# Patient Record
Sex: Female | Born: 1965 | Race: White | Hispanic: No | Marital: Single | Smoking: Never smoker
Health system: Southern US, Community
[De-identification: ages and names within clinical notes are randomized; demographics above are authoritative.]

## PROBLEM LIST (undated history)

## (undated) DIAGNOSIS — E079 Disorder of thyroid, unspecified: Secondary | ICD-10-CM

## (undated) HISTORY — PX: LEG SURGERY: SHX1003

## (undated) HISTORY — PX: KIDNEY STONE SURGERY: SHX686

## (undated) HISTORY — PX: CHOLECYSTECTOMY: SHX55

## (undated) HISTORY — PX: BREAST BIOPSY: SHX20

## (undated) HISTORY — PX: KNEE SURGERY: SHX244

---

## 2005-03-23 ENCOUNTER — Emergency Department: Payer: Self-pay | Admitting: General Practice

## 2005-04-19 ENCOUNTER — Emergency Department: Payer: Self-pay | Admitting: Emergency Medicine

## 2005-10-09 ENCOUNTER — Emergency Department: Payer: Self-pay | Admitting: Emergency Medicine

## 2007-08-01 ENCOUNTER — Ambulatory Visit: Payer: Self-pay

## 2007-08-09 ENCOUNTER — Ambulatory Visit: Payer: Self-pay | Admitting: Surgery

## 2007-08-09 ENCOUNTER — Other Ambulatory Visit: Payer: Self-pay

## 2007-08-16 ENCOUNTER — Ambulatory Visit: Payer: Self-pay | Admitting: Surgery

## 2009-04-09 ENCOUNTER — Ambulatory Visit: Payer: Self-pay | Admitting: Family Medicine

## 2010-03-30 ENCOUNTER — Ambulatory Visit: Payer: Self-pay | Admitting: Family Medicine

## 2010-05-30 ENCOUNTER — Emergency Department: Payer: Self-pay | Admitting: Emergency Medicine

## 2011-05-25 ENCOUNTER — Ambulatory Visit: Payer: Self-pay | Admitting: Family Medicine

## 2011-06-07 ENCOUNTER — Ambulatory Visit: Payer: Self-pay | Admitting: Family Medicine

## 2012-03-12 ENCOUNTER — Ambulatory Visit: Payer: Self-pay | Admitting: Family Medicine

## 2015-03-24 ENCOUNTER — Ambulatory Visit
Admit: 2015-03-24 | Disposition: A | Discharge status: Home / Self Care | Payer: Self-pay | Attending: Family Medicine | Admitting: Family Medicine

## 2015-08-01 ENCOUNTER — Other Ambulatory Visit: Payer: Self-pay

## 2015-08-01 ENCOUNTER — Ambulatory Visit
Admission: EM | Admit: 2015-08-01 | Discharge: 2015-08-01 | Disposition: A | Discharge status: Home / Self Care | Payer: BC Managed Care – PPO | Attending: Family Medicine | Admitting: Family Medicine

## 2015-08-01 ENCOUNTER — Encounter: Payer: Self-pay | Admitting: Gynecology

## 2015-08-01 DIAGNOSIS — R0789 Other chest pain: Secondary | ICD-10-CM | POA: Insufficient documentation

## 2015-08-01 DIAGNOSIS — E079 Disorder of thyroid, unspecified: Secondary | ICD-10-CM | POA: Insufficient documentation

## 2015-08-01 DIAGNOSIS — J0100 Acute maxillary sinusitis, unspecified: Secondary | ICD-10-CM | POA: Diagnosis not present

## 2015-08-01 DIAGNOSIS — F419 Anxiety disorder, unspecified: Secondary | ICD-10-CM | POA: Diagnosis not present

## 2015-08-01 DIAGNOSIS — R51 Headache: Secondary | ICD-10-CM | POA: Diagnosis present

## 2015-08-01 HISTORY — DX: Disorder of thyroid, unspecified: E07.9

## 2015-08-01 MED ORDER — AZITHROMYCIN 250 MG PO TABS: ORAL_TABLET | ORAL | Status: DC

## 2015-08-01 NOTE — ED Notes (Signed)
Patient c/o facial pain / lower jaw pain x 2 days. Pt. C/o anxiety attack.

## 2015-08-01 NOTE — ED Provider Notes (Signed)
CSN: [id number]     Arrival date & time 08/01/15  1344 History   First MD Initiated Contact with Patient 08/01/15 1507     Chief Complaint  Patient presents with  . Facial Pain  . Anxiety   (Consider location/radiation/quality/duration/timing/severity/associated sxs/prior Treatment) Patient is a 49 y.o. female presenting with URI. The history is provided by the patient.  URI Presenting symptoms: congestion, facial pain and fatigue   Severity:  Moderate Onset quality:  Sudden Duration:  1 week Timing:  Constant Progression:  Worsening Chronicity:  New Relieved by:  Nothing Associated symptoms: neck pain and sinus pain   Associated symptoms comment:  Anxiety; states felt slight chest discomfort since last night "probably due to anxiety or worrying".   Past Medical History  Diagnosis Date  . Thyroid disease    Past Surgical History  Procedure Laterality Date  . Leg surgery      left  . Knee surgery      left  . Cholecystectomy    . Kidney stone surgery     No family history on file. Social History  Substance Use Topics  . Smoking status: Never Smoker   . Smokeless tobacco: None  . Alcohol Use: No   OB History    No data available     Review of Systems  Constitutional: Positive for fatigue.  HENT: Positive for congestion.   Musculoskeletal: Positive for neck pain.    Allergies  Iodine; Latex; Penicillins; Sulfa antibiotics; and Ciprofloxacin  Home Medications   Prior to Admission medications   Medication Sig Start Date End Date Taking? Authorizing Provider  levothyroxine (SYNTHROID, LEVOTHROID) 125 MCG tablet Take 125 mcg by mouth daily before breakfast.   Yes Historical Provider, MD  azithromycin (ZITHROMAX Z-PAK) 250 MG tablet 2 tabs po once day 1, then 1 tab po qd for next 4 days 08/01/15   Stephanie Mccallum, MD   BP 129/69 mmHg  Pulse 60  Temp(Src) 97.7 F (36.5 C) (Oral)  Resp 16  Ht  (1.778 m)  Wt 225 lb (102.059 kg)  BMI 32.28 kg/m2  SpO2  100% Physical Exam  Constitutional: She appears well-developed and well-nourished. No distress.  HENT:  Head: Normocephalic and atraumatic.  Right Ear: Tympanic membrane, external ear and ear canal normal.  Left Ear: Tympanic membrane, external ear and ear canal normal.  Nose: Mucosal edema and rhinorrhea present. No nose lacerations, sinus tenderness, nasal deformity, septal deviation or nasal septal hematoma. No epistaxis.  No foreign bodies. Right sinus exhibits maxillary sinus tenderness and frontal sinus tenderness. Left sinus exhibits maxillary sinus tenderness and frontal sinus tenderness.  Mouth/Throat: Uvula is midline, oropharynx is clear and moist and mucous membranes are normal. No oropharyngeal exudate.  Eyes: Conjunctivae and EOM are normal. Pupils are equal, round, and reactive to light. Right eye exhibits no discharge. Left eye exhibits no discharge. No scleral icterus.  Neck: Normal range of motion. Neck supple. No thyromegaly present.  Cardiovascular: Normal rate, regular rhythm and normal heart sounds.   Pulmonary/Chest: Effort normal and breath sounds normal. No respiratory distress. She has no wheezes. She has no rales.  Lymphadenopathy:    She has no cervical adenopathy.  Skin: She is not diaphoretic.  Nursing note and vitals reviewed.   ED Course  Procedures (including critical care time) Labs Review Labs Reviewed - No data to display  Imaging Review No results found.  EKG: normal EKG, normal sinus rhythm, there are no previous tracings available for comparison; reviewed by  me and agree with readout. MDM   1. Acute maxillary sinusitis, recurrence not specified   2. Anxiety   3. Chest discomfort    Discharge Medication List as of 08/01/2015  3:41 PM    START taking these medications   Details  azithromycin (ZITHROMAX Z-PAK) 250 MG tablet 2 tabs po once day 1, then 1 tab po qd for next 4 days, Normal      Plan: 1. diagnosis reviewed with patient 2. rx as  per orders; risks, benefits, potential side effects reviewed with patient 3. Recommend supportive treatment with otc flonase 4. F/u prn if symptoms worsen or don't improve    Stephanie Mccallum, MD 08/02/15 1455

## 2017-04-22 ENCOUNTER — Ambulatory Visit
Admission: EM | Admit: 2017-04-22 | Discharge: 2017-04-22 | Disposition: A | Discharge status: Home / Self Care | Payer: BC Managed Care – PPO | Attending: Family Medicine | Admitting: Family Medicine

## 2017-04-22 DIAGNOSIS — J069 Acute upper respiratory infection, unspecified: Secondary | ICD-10-CM | POA: Diagnosis not present

## 2017-04-22 DIAGNOSIS — R05 Cough: Secondary | ICD-10-CM

## 2017-04-22 DIAGNOSIS — B9789 Other viral agents as the cause of diseases classified elsewhere: Secondary | ICD-10-CM

## 2017-04-22 LAB — RAPID INFLUENZA A&B ANTIGENS (ARMC ONLY)
INFLUENZA A (ARMC): NEGATIVE
INFLUENZA B (ARMC): NEGATIVE

## 2017-04-22 NOTE — ED Triage Notes (Signed)
Patient complains of cough, congestion, fever, severe body aches. Patient states that symptoms started suddently on Friday PM. Patient states that she feels like she has the flu.

## 2017-04-22 NOTE — ED Provider Notes (Signed)
[facility]    CSN: [id number] Arrival date & time: 04/22/17  1304     History   Chief Complaint Chief Complaint  Patient presents with  . Cough    HPI Stephanie Bright is a 51 y.o. female.    Cough  Associated symptoms: fever and myalgias   Associated symptoms: no headaches and no wheezing   URI  Presenting symptoms: congestion, cough and fever   Severity:  Moderate Onset quality:  Sudden Timing:  Constant Progression:  Worsening Chronicity:  New Relieved by:  OTC medications Associated symptoms: myalgias   Associated symptoms: no headaches and no wheezing   Risk factors: not elderly, no chronic cardiac disease, no chronic kidney disease, no chronic respiratory disease, no diabetes mellitus, no immunosuppression, no recent illness and no recent travel     Past Medical History:  Diagnosis Date  . Thyroid disease     There are no active problems to display for this patient.   Past Surgical History:  Procedure Laterality Date  . CHOLECYSTECTOMY    . KIDNEY STONE SURGERY    . KNEE SURGERY     left  . LEG SURGERY     left    OB History    No data available       Home Medications    Prior to Admission medications   Medication Sig Start Date End Date Taking? Authorizing Provider  levothyroxine (SYNTHROID, LEVOTHROID) 125 MCG tablet Take 125 mcg by mouth daily before breakfast.   Yes [provider]  azithromycin (ZITHROMAX Z-PAK) 250 MG tablet 2 tabs po once day 1, then 1 tab po qd for next 4 days 08/01/15   Payton Mccallum, MD    Family History History reviewed. No pertinent family history.  Social History Social History  Substance Use Topics  . Smoking status: Never Smoker  . Smokeless tobacco: Never Used  . Alcohol use No     Allergies   Iodine; Latex; Penicillins; Sulfa antibiotics; and Ciprofloxacin   Review of Systems Review of Systems  Constitutional: Positive for fever.  HENT: Positive for congestion.    Respiratory: Positive for cough. Negative for wheezing.   Musculoskeletal: Positive for myalgias.  Neurological: Negative for headaches.     Physical Exam Triage Vital Signs ED Triage Vitals  Enc Vitals Group     BP 04/22/17 1346 134/76     Pulse Rate 04/22/17 1346 68     Resp 04/22/17 1346 18     Temp 04/22/17 1346 98 F (36.7 C)     Temp Source 04/22/17 1346 Oral     SpO2 04/22/17 1346 100 %     Weight 04/22/17 1345 225 lb (102.1 kg)     Height 04/22/17 1345 5\' 10"  (1.778 m)     Head Circumference --      Peak Flow --      Pain Score 04/22/17 1345 5     Pain Loc --      Pain Edu? --      Excl. in [place]? --    No data found.   Updated Vital Signs BP 134/76 (BP Location: Left Arm)   Pulse 68   Temp 98 F (36.7 C) (Oral)   Resp 18   Ht 5\' 10"  (1.778 m)   Wt 225 lb (102.1 kg)   SpO2 100%   BMI 32.28 kg/m   Visual Acuity Right Eye Distance:   Left Eye Distance:   Bilateral Distance:    Right Eye  Near:   Left Eye Near:    Bilateral Near:     Physical Exam  Constitutional: She appears well-developed and well-nourished. No distress.  HENT:  Head: Normocephalic and atraumatic.  Right Ear: Tympanic membrane, external ear and ear canal normal.  Left Ear: Tympanic membrane, external ear and ear canal normal.  Nose: No mucosal edema, rhinorrhea, nose lacerations, sinus tenderness, nasal deformity, septal deviation or nasal septal hematoma. No epistaxis.  No foreign bodies. Right sinus exhibits no maxillary sinus tenderness and no frontal sinus tenderness. Left sinus exhibits no maxillary sinus tenderness and no frontal sinus tenderness.  Mouth/Throat: Uvula is midline, oropharynx is clear and moist and mucous membranes are normal. No oropharyngeal exudate.  Eyes: Conjunctivae and EOM are normal. Pupils are equal, round, and reactive to light. Right eye exhibits no discharge. Left eye exhibits no discharge. No scleral icterus.  Neck: Normal range of motion. Neck supple.  No thyromegaly present.  Cardiovascular: Normal rate, regular rhythm and normal heart sounds.   Pulmonary/Chest: Effort normal and breath sounds normal. No respiratory distress. She has no wheezes. She has no rales.  Lymphadenopathy:    She has no cervical adenopathy.  Skin: She is not diaphoretic.  Nursing note and vitals reviewed.    UC Treatments / Results  Labs (all labs ordered are listed, but only abnormal results are displayed) Labs Reviewed  RAPID INFLUENZA A&B ANTIGENS ([facility] ONLY)    EKG  EKG Interpretation None       Radiology No results found.  Procedures Procedures (including critical care time)  Medications Ordered in UC Medications - No data to display   Initial Impression / Assessment and Plan / UC Course  I have reviewed the triage vital signs and the nursing notes.  Pertinent labs & imaging results that were available during my care of the patient were reviewed by me and considered in my medical decision making (see chart for details).       Final Clinical Impressions(s) / UC Diagnoses   Final diagnoses:  Viral URI with cough    New Prescriptions Discharge Medication List as of 04/22/2017  2:26 PM     1. diagnosis reviewed with patient 2. Recommend continue supportive treatment with otc meds, rest, fluids 3. Follow-up prn if symptoms worsen or don't improve   Payton Mccallumonty, Ondre Salvetti, MD 04/22/17 1440

## 2018-06-13 ENCOUNTER — Other Ambulatory Visit: Payer: Self-pay

## 2018-06-13 ENCOUNTER — Encounter: Payer: Self-pay | Admitting: Emergency Medicine

## 2018-06-13 ENCOUNTER — Ambulatory Visit
Admission: EM | Admit: 2018-06-13 | Discharge: 2018-06-13 | Disposition: A | Discharge status: Home / Self Care | Payer: BC Managed Care – PPO | Attending: Family Medicine | Admitting: Family Medicine

## 2018-06-13 DIAGNOSIS — M5489 Other dorsalgia: Secondary | ICD-10-CM

## 2018-06-13 DIAGNOSIS — Z79890 Hormone replacement therapy (postmenopausal): Secondary | ICD-10-CM | POA: Diagnosis not present

## 2018-06-13 DIAGNOSIS — Z880 Allergy status to penicillin: Secondary | ICD-10-CM | POA: Insufficient documentation

## 2018-06-13 DIAGNOSIS — Z841 Family history of disorders of kidney and ureter: Secondary | ICD-10-CM | POA: Insufficient documentation

## 2018-06-13 DIAGNOSIS — Z881 Allergy status to other antibiotic agents status: Secondary | ICD-10-CM | POA: Diagnosis not present

## 2018-06-13 DIAGNOSIS — Z833 Family history of diabetes mellitus: Secondary | ICD-10-CM | POA: Diagnosis not present

## 2018-06-13 DIAGNOSIS — Z9049 Acquired absence of other specified parts of digestive tract: Secondary | ICD-10-CM | POA: Diagnosis not present

## 2018-06-13 DIAGNOSIS — Z801 Family history of malignant neoplasm of trachea, bronchus and lung: Secondary | ICD-10-CM | POA: Diagnosis not present

## 2018-06-13 DIAGNOSIS — Z888 Allergy status to other drugs, medicaments and biological substances status: Secondary | ICD-10-CM | POA: Diagnosis not present

## 2018-06-13 DIAGNOSIS — R0789 Other chest pain: Secondary | ICD-10-CM | POA: Diagnosis not present

## 2018-06-13 DIAGNOSIS — E669 Obesity, unspecified: Secondary | ICD-10-CM | POA: Diagnosis not present

## 2018-06-13 DIAGNOSIS — E039 Hypothyroidism, unspecified: Secondary | ICD-10-CM | POA: Diagnosis not present

## 2018-06-13 DIAGNOSIS — Z6833 Body mass index (BMI) 33.0-33.9, adult: Secondary | ICD-10-CM | POA: Diagnosis not present

## 2018-06-13 DIAGNOSIS — Z882 Allergy status to sulfonamides status: Secondary | ICD-10-CM | POA: Insufficient documentation

## 2018-06-13 DIAGNOSIS — Z8249 Family history of ischemic heart disease and other diseases of the circulatory system: Secondary | ICD-10-CM | POA: Insufficient documentation

## 2018-06-13 DIAGNOSIS — Z823 Family history of stroke: Secondary | ICD-10-CM | POA: Insufficient documentation

## 2018-06-13 DIAGNOSIS — R079 Chest pain, unspecified: Secondary | ICD-10-CM | POA: Diagnosis present

## 2018-06-13 NOTE — ED Triage Notes (Signed)
Patient c/o chest pain that around 2 pm.  Patient reports pain that gets worse when she takes a deep breath.

## 2018-06-13 NOTE — ED Provider Notes (Signed)
[facility]    CSN: [id number] Arrival date & time: 06/13/18  1737  History   Chief Complaint Chief Complaint  Patient presents with  . Chest Pain   HPI  52 year old female presents with chest pain.  Patient states that she was pulling weeds earlier today.  She states that she was at home lying in the bed and developed chest pain.  Location: Sternal.  Describes the pain as sharp.  Worse with inspiration and certain movements.  Relieved with rest.  No nausea or vomiting.  No diaphoresis.  No association with exertion.  No shortness of breath.  Patient also reports associated back pain.  No other symptoms.  No other complaints at this time.  PMH: Hypothyroid, unspecified 03/05/2012   Interstitial cystitis  with hematuria  Obesity  Surgical Hx: CHOLECYSTECTOMY      steel rod in left femur 1993 Left    BREAST LUMPECTOMY 2011, 2009 Right    ROUX-EN-Y PROCEDURE 2009     PHOTOREFRACTIVE KERATOTOMY/LASIK      knee surgery for necrotic tissue removal      Home Medications    Prior to Admission medications   Medication Sig Start Date End Date Taking? Authorizing Provider  levothyroxine (SYNTHROID, LEVOTHROID) 125 MCG tablet Take 125 mcg by mouth daily before breakfast.   Yes [provider]  azithromycin (ZITHROMAX Z-PAK) 250 MG tablet 2 tabs po once day 1, then 1 tab po qd for next 4 days 08/01/15   Payton Mccallum, MD   Family History Prostate cancer Father    Stroke Father    Alzheimer's disease Maternal Grandmother    High blood pressure (Hypertension) Maternal Grandmother    Breast cancer Mother    Cataracts Mother    Chronic kidney disease Mother    Diabetes type II Mother    High blood pressure (Hypertension) Mother    Lung cancer Mother    Thyroid disease Mother    Coronary Artery Disease (Blocked arteries around heart) Paternal Grandmother    Coronary Artery Disease (Blocked arteries around heart) Paternal Uncle     Cancer Paternal Uncle half   Coronary Artery Disease (Blocked arteries around heart) Paternal Uncle half   No Known Problems Sister     Social History Social History   Tobacco Use  . Smoking status: Never Smoker  . Smokeless tobacco: Never Used  Substance Use Topics  . Alcohol use: No  . Drug use: No   Allergies   Iodine; Latex; Penicillins; Sulfa antibiotics; and Ciprofloxacin  Review of Systems Review of Systems  Respiratory: Negative.   Cardiovascular: Positive for chest pain.  Musculoskeletal: Positive for back pain.   Physical Exam Triage Vital Signs ED Triage Vitals  Enc Vitals Group     BP 06/13/18 1746 129/81     Pulse Rate 06/13/18 1746 68     Resp 06/13/18 1746 16     Temp 06/13/18 1746 98 F (36.7 C)     Temp Source 06/13/18 1746 Oral     SpO2 06/13/18 1746 100 %     Weight 06/13/18 1744 235 lb (106.6 kg)     Height 06/13/18 1744 5\' 10"  (1.778 m)     Head Circumference --      Peak Flow --      Pain Score 06/13/18 1744 5     Pain Loc --      Pain Edu? --      Excl. in GC? --    Updated Vital Signs BP  129/81 (BP Location: Left Arm)   Pulse 68   Temp 98 F (36.7 C) (Oral)   Resp 16   Ht 5\' 10"  (1.778 m)   Wt 235 lb (106.6 kg)   SpO2 100%   BMI 33.72 kg/m   Physical Exam  Constitutional: She is oriented to person, place, and time. She appears well-developed. No distress.  HENT:  Head: Normocephalic and atraumatic.  Mouth/Throat: Oropharynx is clear and moist.  Cardiovascular: Normal rate and regular rhythm.  Pulmonary/Chest: Effort normal and breath sounds normal. She has no wheezes. She has no rales. She exhibits no tenderness.  Abdominal: Soft. She exhibits no distension. There is no tenderness.  Neurological: She is alert and oriented to person, place, and time.  Psychiatric: She has a normal mood and affect. Her behavior is normal.  Nursing note and vitals reviewed.  UC Treatments / Results  Labs (all labs ordered are listed, but  only abnormal results are displayed) Labs Reviewed - No data to display  EKG   Date: 06/13/2018  EKG Time: 6:15 PM  Rate: 69  Rhythm: Normal Sinus rhythm.  Axis: Normal.   Intervals:Normal.  ST&T Change: None.  Narrative Interpretation:  Normal sinus rhythm with a rate of 69.  Normal intervals.  No ST or T wave changes.  Normal EKG.  Radiology No results found.  Procedures Procedures (including critical care time)  Medications Ordered in UC Medications - No data to display  Initial Impression / Assessment and Plan / UC Course  I have reviewed the triage vital signs and the nursing notes.  Pertinent labs & imaging results that were available during my care of the patient were reviewed by me and considered in my medical decision making (see chart for details).    52 year old female presents with chest pain.  Likely musculoskeletal/chest wall pain.  EKG negative.  History not consistent with cardiac chest pain.  Advised Tylenol and supportive care.  Final Clinical Impressions(s) / UC Diagnoses   Final diagnoses:  Chest wall pain     Discharge Instructions     Rest.  Tylenol.  If persists or worsens, go to the hospital.  Take care  Dr. Adriana Simasook     ED Prescriptions    None     Controlled Substance Prescriptions [place] Controlled Substance Registry consulted? Not Applicable   Tommie SamsCook, Deanna Wiater G, DO 06/13/18 1815

## 2018-06-13 NOTE — Discharge Instructions (Signed)
Rest.  Tylenol.  If persists or worsens, go to the hospital.  Take care  Dr. Adriana Simasook

## 2019-05-26 DIAGNOSIS — R197 Diarrhea, unspecified: Secondary | ICD-10-CM | POA: Diagnosis not present

## 2019-05-30 DIAGNOSIS — B001 Herpesviral vesicular dermatitis: Secondary | ICD-10-CM | POA: Diagnosis not present

## 2019-05-30 DIAGNOSIS — Z1231 Encounter for screening mammogram for malignant neoplasm of breast: Secondary | ICD-10-CM | POA: Diagnosis not present

## 2019-05-30 DIAGNOSIS — R197 Diarrhea, unspecified: Secondary | ICD-10-CM | POA: Diagnosis not present

## 2019-10-09 ENCOUNTER — Other Ambulatory Visit: Payer: Self-pay | Admitting: Family Medicine

## 2019-10-09 DIAGNOSIS — Z1231 Encounter for screening mammogram for malignant neoplasm of breast: Secondary | ICD-10-CM

## 2019-10-17 ENCOUNTER — Other Ambulatory Visit: Payer: Self-pay

## 2019-10-17 ENCOUNTER — Ambulatory Visit
Admission: EM | Admit: 2019-10-17 | Discharge: 2019-10-17 | Disposition: A | Discharge status: Home / Self Care | Payer: BC Managed Care – PPO | Attending: Family Medicine | Admitting: Family Medicine

## 2019-10-17 DIAGNOSIS — G44209 Tension-type headache, unspecified, not intractable: Secondary | ICD-10-CM | POA: Diagnosis present

## 2019-10-17 DIAGNOSIS — M940 Chondrocostal junction syndrome [Tietze]: Secondary | ICD-10-CM | POA: Diagnosis present

## 2019-10-17 DIAGNOSIS — F419 Anxiety disorder, unspecified: Secondary | ICD-10-CM | POA: Diagnosis present

## 2019-10-17 NOTE — ED Triage Notes (Signed)
Patient states that she has been having anxiety that has started over the past month or so. Patient states that last night she was having some anxiety last night  Patient states that she lifted something heavy and has had pain in between her shoulders and chest pain that started last night, headache started last night as well. Patient states that she feels like her breasts are swollen like when she has menstrual cycle. Patient states that she just felt like she needed to come and get checked out.

## 2019-10-17 NOTE — ED Provider Notes (Signed)
[facility]    CSN: [id number] Arrival date & time: 10/17/19  0831      History   Chief Complaint Chief Complaint  Patient presents with  . Headache  . Anxiety    HPI Stephanie Bright is a 53 y.o. female.   53 yo female with a c/o anxiety over the past month or two. Also c/o intermittent chest heaviness and headaches. Denies any fevers, chills, cough, sick contacts.      Past Medical History:  Diagnosis Date  . Thyroid disease     There are no active problems to display for this patient.   Past Surgical History:  Procedure Laterality Date  . CHOLECYSTECTOMY    . KIDNEY STONE SURGERY    . KNEE SURGERY     left  . LEG SURGERY     left    OB History   No obstetric history on file.      Home Medications    Prior to Admission medications   Medication Sig Start Date End Date Taking? Authorizing Provider  levothyroxine (SYNTHROID, LEVOTHROID) 125 MCG tablet Take 125 mcg by mouth daily before breakfast.   Yes [provider]  azithromycin (ZITHROMAX Z-PAK) 250 MG tablet 2 tabs po once day 1, then 1 tab po qd for next 4 days 08/01/15   Norval Gable, MD    Family History Family History  Problem Relation Age of Onset  . Diabetes Mother   . Kidney disease Mother   . Lung cancer Mother   . Breast cancer Mother   . CVA Father   . Heart attack Father     Social History Social History   Tobacco Use  . Smoking status: Never Smoker  . Smokeless tobacco: Never Used  Substance Use Topics  . Alcohol use: No  . Drug use: No     Allergies   Iodine, Latex, Penicillins, Sulfa antibiotics, and Ciprofloxacin   Review of Systems Review of Systems   Physical Exam Triage Vital Signs ED Triage Vitals  Enc Vitals Group     BP 10/17/19 0849 128/68     Pulse Rate 10/17/19 0849 61     Resp 10/17/19 0849 18     Temp 10/17/19 0849 97.7 F (36.5 C)     Temp Source 10/17/19 0849 Oral     SpO2 10/17/19 0849 100 %     Weight  10/17/19 0847 (!) 336 lb (152.4 kg)     Height 10/17/19 0847 5\' 10"  (1.778 m)     Head Circumference --      Peak Flow --      Pain Score 10/17/19 0847 7     Pain Loc --      Pain Edu? --      Excl. in [place]? --    No data found.  Updated Vital Signs BP 128/68 (BP Location: Left Arm)   Pulse 61   Temp 97.7 F (36.5 C) (Oral)   Resp 18   Ht 5\' 10"  (1.778 m)   Wt (!) 152.4 kg   SpO2 100%   BMI 48.21 kg/m   Visual Acuity Right Eye Distance:   Left Eye Distance:   Bilateral Distance:    Right Eye Near:   Left Eye Near:    Bilateral Near:     Physical Exam Vitals signs and nursing note reviewed.  Constitutional:      General: She is not in acute distress.    Appearance: She is not toxic-appearing or  diaphoretic.  Eyes:     Extraocular Movements: Extraocular movements intact.     Pupils: Pupils are equal, round, and reactive to light.  Cardiovascular:     Rate and Rhythm: Normal rate and regular rhythm.     Heart sounds: Normal heart sounds.  Pulmonary:     Effort: Pulmonary effort is normal. No respiratory distress.     Breath sounds: Normal breath sounds.  Chest:     Chest wall: Tenderness present.  Musculoskeletal:     Right lower leg: No edema.     Left lower leg: No edema.  Neurological:     General: No focal deficit present.     Mental Status: She is alert and oriented to person, place, and time.      UC Treatments / Results  Labs (all labs ordered are listed, but only abnormal results are displayed) Labs Reviewed - No data to display  EKG   Radiology No results found.  Procedures ED EKG  Date/Time: 10/31/2019 11:25 AM Performed by: Payton Mccallum, MD Authorized by: Payton Mccallum, MD   ECG reviewed by ED Physician in the absence of a cardiologist: yes   Previous ECG:    Previous ECG:  Compared to current   Similarity:  No change Interpretation:    Interpretation: normal   Rate:    ECG rate assessment: bradycardic   Rhythm:    Rhythm:  sinus bradycardia   Ectopy:    Ectopy: none   QRS:    QRS axis:  Normal   QRS intervals:  Normal Conduction:    Conduction: normal   ST segments:    ST segments:  Normal T waves:    T waves: normal     (including critical care time)  Medications Ordered in UC Medications - No data to display  Initial Impression / Assessment and Plan / UC Course  I have reviewed the triage vital signs and the nursing notes.  Pertinent labs & imaging results that were available during my care of the patient were reviewed by me and considered in my medical decision making (see chart for details).     Final Clinical Impressions(s) / UC Diagnoses   Final diagnoses:  Anxiety  Acute non intractable tension-type headache  Costochondritis     Discharge Instructions     Rest, over the counter tylenol/motrin as needed Follow up with primary care provider    ED Prescriptions    None      1. ekg results and diagnosis reviewed with patient 2. Recommend supportive treatment as above 3. F/u with pcp  4. Follow-up prn if symptoms worsen or don't improve   PDMP not reviewed this encounter.   Payton Mccallum, MD 10/31/19 [phone]

## 2019-10-17 NOTE — Discharge Instructions (Signed)
Rest, over the counter tylenol/motrin as needed Follow up with primary care provider

## 2020-01-21 ENCOUNTER — Other Ambulatory Visit: Payer: Self-pay

## 2020-01-21 ENCOUNTER — Ambulatory Visit
Admission: RE | Admit: 2020-01-21 | Discharge: 2020-01-21 | Disposition: A | Discharge status: Home / Self Care | Payer: BC Managed Care – PPO | Source: Ambulatory Visit | Attending: Family Medicine | Admitting: Family Medicine

## 2020-01-21 DIAGNOSIS — Z1231 Encounter for screening mammogram for malignant neoplasm of breast: Secondary | ICD-10-CM | POA: Diagnosis present

## 2020-01-26 ENCOUNTER — Other Ambulatory Visit: Payer: Self-pay | Admitting: *Deleted

## 2020-01-26 ENCOUNTER — Inpatient Hospital Stay
Admission: RE | Admit: 2020-01-26 | Discharge: 2020-01-26 | Disposition: A | Discharge status: Home / Self Care | Payer: Self-pay | Source: Ambulatory Visit | Attending: *Deleted | Admitting: *Deleted

## 2020-01-26 DIAGNOSIS — Z1231 Encounter for screening mammogram for malignant neoplasm of breast: Secondary | ICD-10-CM

## 2021-01-02 IMAGING — MG DIGITAL SCREENING BILAT W/ TOMO W/ CAD
6 of 12 series · 6 of 36 positions shown · non-contrast
Comparison: Previous exam(s).

ACR Breast Density Category a: The breast tissue is almost entirely
fatty.

CLINICAL DATA: Screening.

EXAM:
DIGITAL SCREENING BILATERAL MAMMOGRAM WITH TOMO AND CAD

[R CC synth-2D]
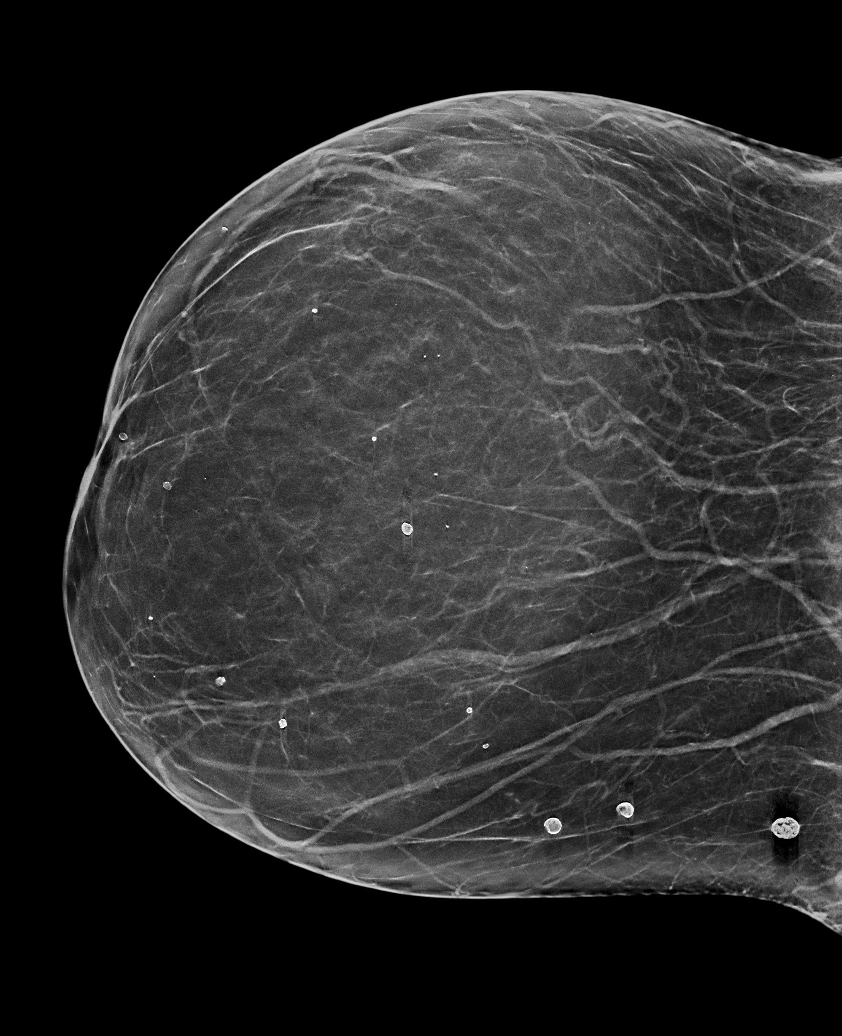

[L CC synth-2D]
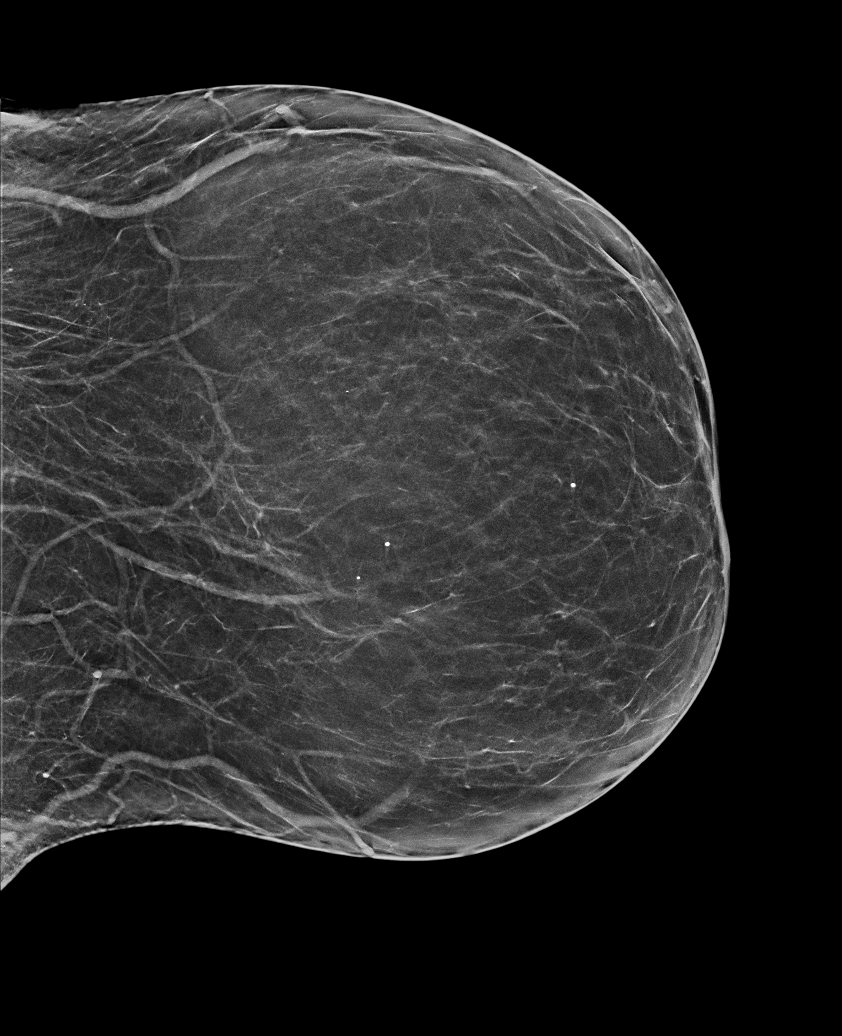

[R MLO synth-2D (1 of 2)]
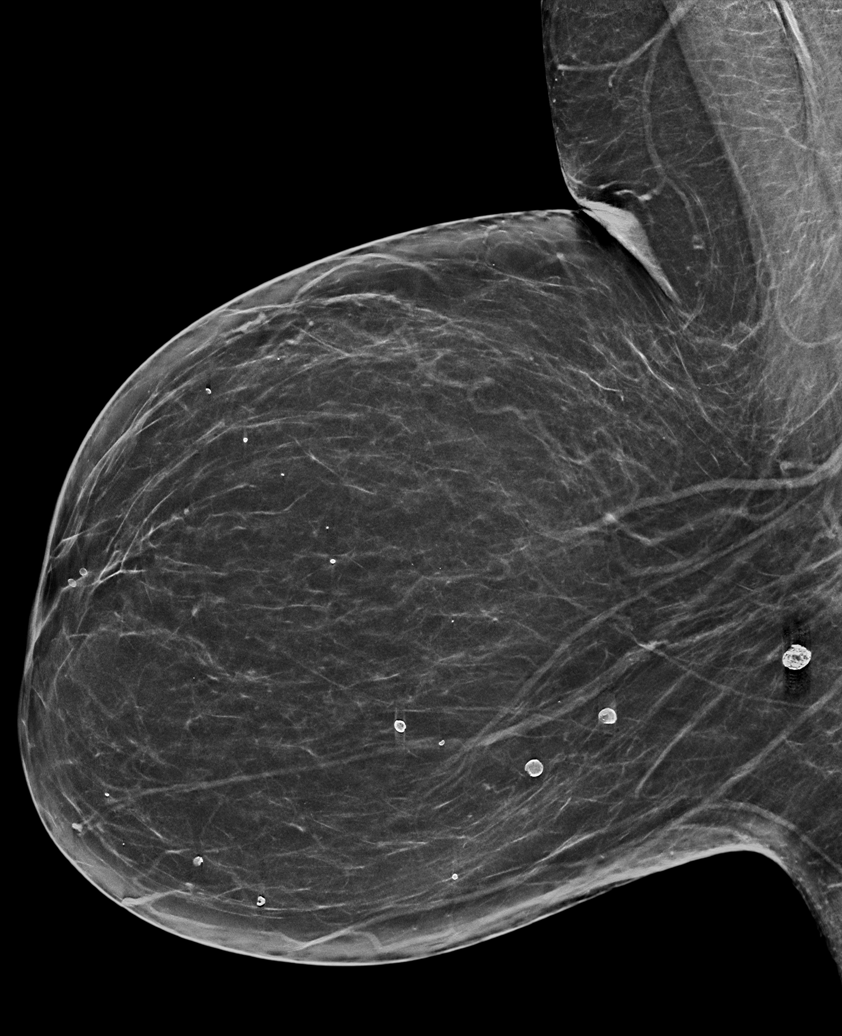

[R MLO synth-2D (2 of 2)]
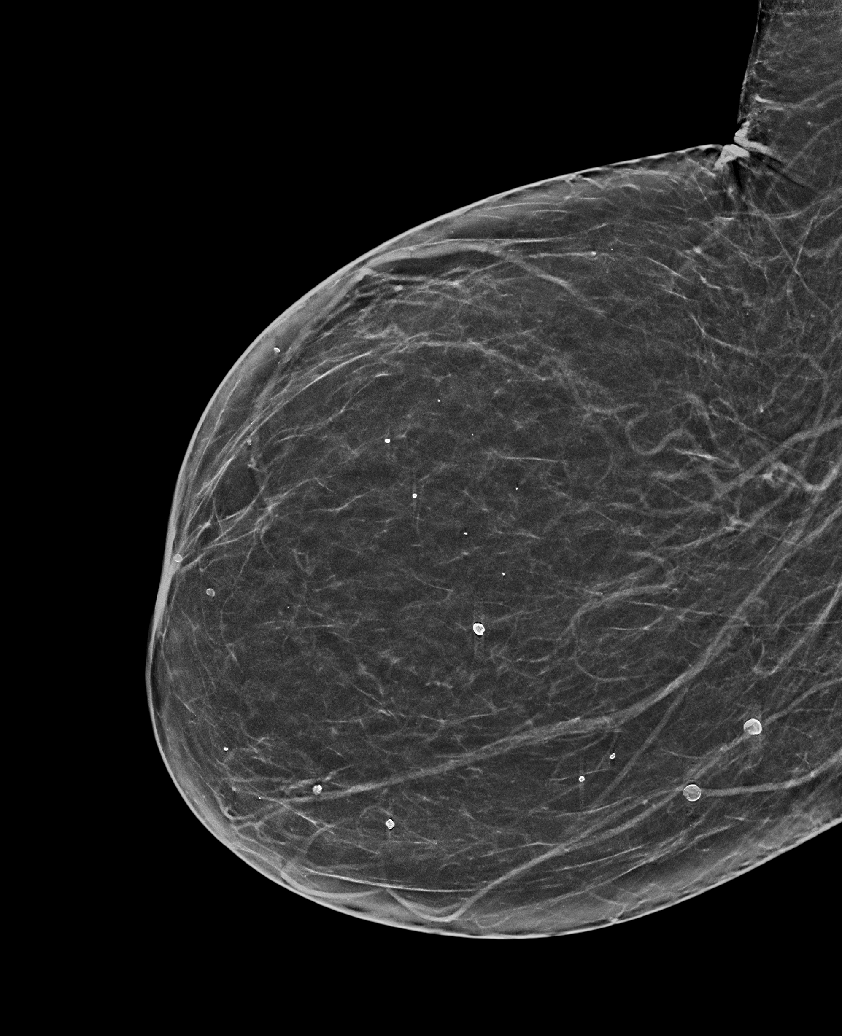

[L MLO synth-2D (1 of 2)]
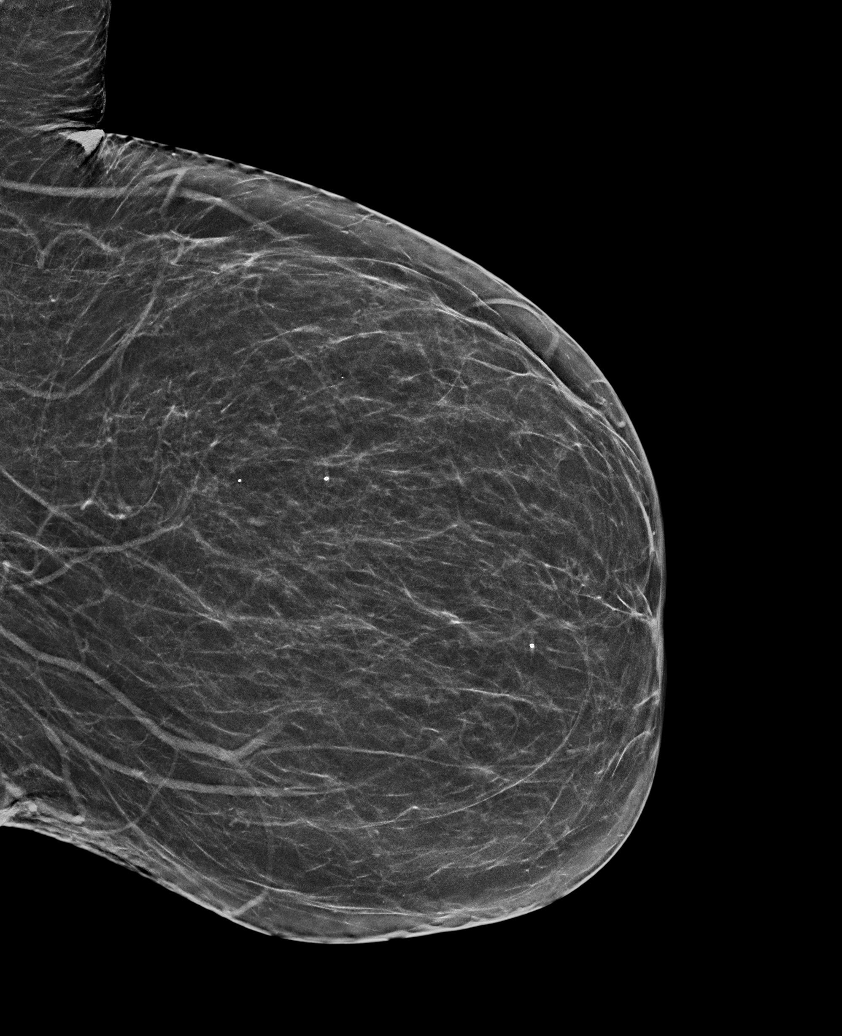

[L MLO synth-2D (2 of 2)]
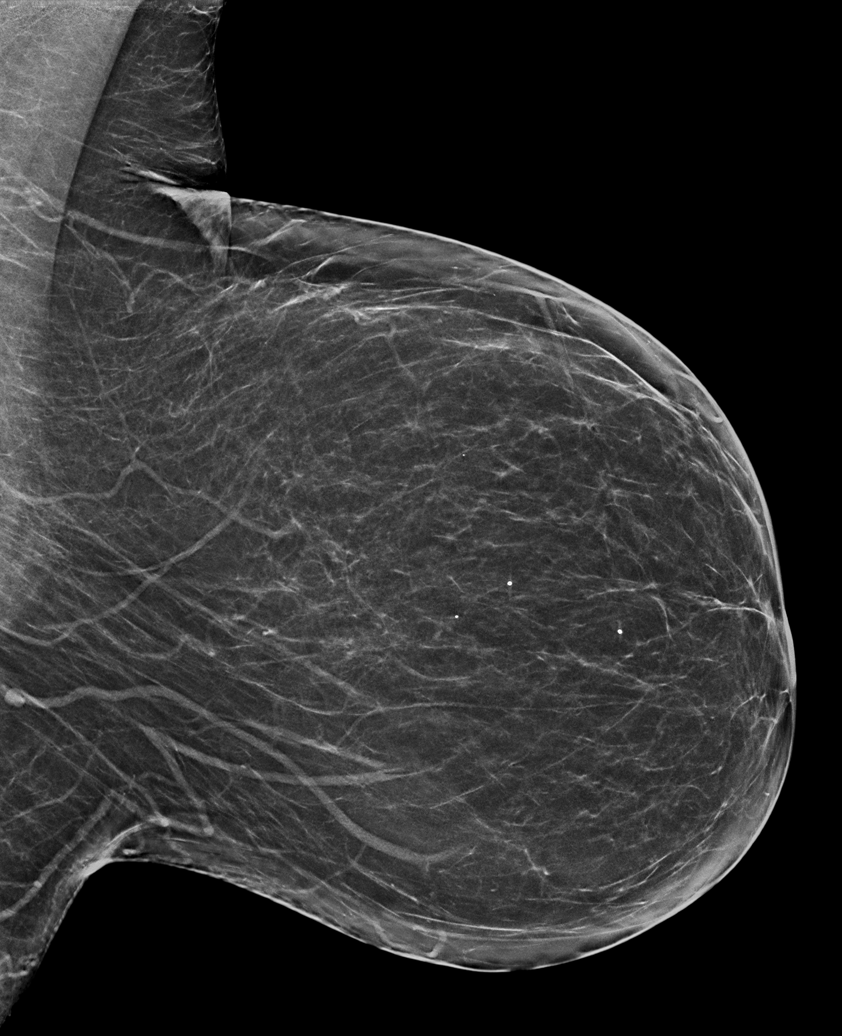

[6 of 36 positions shown; findings below may reference images not displayed]

FINDINGS: There are no findings suspicious for malignancy. Images were
processed with CAD.
IMPRESSION: No mammographic evidence of malignancy. A result letter of this
screening mammogram will be mailed directly to the patient.

RECOMMENDATION:
Screening mammogram in one year. (Code:8Y-[id number])

BI-RADS CATEGORY  1: Negative.

## 2023-04-10 ENCOUNTER — Other Ambulatory Visit: Payer: Self-pay | Admitting: Internal Medicine

## 2023-04-10 DIAGNOSIS — Z1231 Encounter for screening mammogram for malignant neoplasm of breast: Secondary | ICD-10-CM

## 2023-06-04 ENCOUNTER — Ambulatory Visit
Admission: RE | Admit: 2023-06-04 | Discharge: 2023-06-04 | Disposition: A | Discharge status: Home / Self Care | Payer: BC Managed Care – PPO | Source: Ambulatory Visit | Attending: Internal Medicine | Admitting: Internal Medicine

## 2023-06-04 DIAGNOSIS — Z1231 Encounter for screening mammogram for malignant neoplasm of breast: Secondary | ICD-10-CM | POA: Diagnosis present

## 2024-08-17 ENCOUNTER — Ambulatory Visit
Admission: EM | Admit: 2024-08-17 | Discharge: 2024-08-17 | Disposition: A | Discharge status: Home / Self Care | Attending: Family Medicine | Admitting: Family Medicine

## 2024-08-17 ENCOUNTER — Ambulatory Visit: Payer: Self-pay | Admitting: Family Medicine

## 2024-08-17 ENCOUNTER — Ambulatory Visit (INDEPENDENT_AMBULATORY_CARE_PROVIDER_SITE_OTHER)

## 2024-08-17 DIAGNOSIS — J181 Lobar pneumonia, unspecified organism: Secondary | ICD-10-CM | POA: Diagnosis not present

## 2024-08-17 DIAGNOSIS — J069 Acute upper respiratory infection, unspecified: Secondary | ICD-10-CM | POA: Diagnosis present

## 2024-08-17 DIAGNOSIS — J189 Pneumonia, unspecified organism: Secondary | ICD-10-CM | POA: Insufficient documentation

## 2024-08-17 LAB — SARS CORONAVIRUS 2 BY RT PCR: SARS Coronavirus 2 by RT PCR: NEGATIVE

## 2024-08-17 MED ORDER — PREDNISONE 10 MG (21) PO TBPK: ORAL_TABLET | Freq: Every day | ORAL | 0 refills | Status: AC

## 2024-08-17 MED ORDER — HYDROCOD POLI-CHLORPHE POLI ER 10-8 MG/5ML PO SUER: 5.0000 mL | Freq: Two times a day (BID) | ORAL | 0 refills | Status: AC | PRN

## 2024-08-17 MED ORDER — ALBUTEROL SULFATE HFA 108 (90 BASE) MCG/ACT IN AERS: 2.0000 | INHALATION_SPRAY | RESPIRATORY_TRACT | 0 refills | Status: AC | PRN

## 2024-08-17 MED ORDER — AZITHROMYCIN 250 MG PO TABS
ORAL_TABLET | ORAL | 0 refills | Status: AC
Start: 2024-08-17 — End: ?

## 2024-08-17 NOTE — Discharge Instructions (Addendum)
 Your chest xray did show some evidence of pneumonia though the radiologist has not yet read it. If they find something that I didn't, I will call you.    Stop by the pharmacy to pick up your prescriptions.  Follow up with your primary care provider or return to the urgent care, if not improving.

## 2024-08-17 NOTE — ED Triage Notes (Signed)
 Pt c/o cough & chest congestion x10 days. States cough worse at night. Has tried OTC cough syrup w/o relief. Denies any hx asthma or COPD.

## 2024-08-17 NOTE — ED Provider Notes (Signed)
 [facility]    CSN: [id number] Arrival date & time: 08/17/24  1112      History   Chief Complaint Chief Complaint  Patient presents with   Cough   Nasal Congestion    HPI Stephanie Bright is a 58 y.o. female.   HPI  History obtained from the patient. [name] presents for over a week of cough, congestion, changes in taste, decreased appetite. The cough gets worse at night.  She is not able to sleep at night due to her hacking cough.  She gags and hacks for hours. Tried guaifenesin without relief. The cough has gotten bad in the past 3 days.  Has been having fevers intermittent for a week. She felt warm but doesn't have a thermometer.  Has some nausea and diarrhea but no vomiting. No known sick contacts.      Past Medical History:  Diagnosis Date   Thyroid disease     There are no active problems to display for this patient.   Past Surgical History:  Procedure Laterality Date   BREAST BIOPSY Right    neg   CHOLECYSTECTOMY     KIDNEY STONE SURGERY     KNEE SURGERY     left   LEG SURGERY     left    OB History   No obstetric history on file.      Home Medications    Prior to Admission medications   Medication Sig Start Date End Date Taking? Authorizing Provider  albuterol  (VENTOLIN  HFA) 108 (90 Base) MCG/ACT inhaler Inhale 2 puffs into the lungs every 4 (four) hours as needed. 08/17/24  Yes Keyden Pavlov, DO  chlorpheniramine-HYDROcodone (TUSSIONEX) 10-8 MG/5ML Take 5 mLs by mouth every 12 (twelve) hours as needed. 08/17/24  Yes Makaylen Thieme, DO  levothyroxine (SYNTHROID) 125 MCG tablet Take 1 tablet (125 mcg total) by mouth once daily Take on an empty stomach with a glass of water at least 30-60 minutes before breakfast. BRAND NAME ONLY 02/06/24 02/05/25 Yes [provider]  predniSONE  (STERAPRED UNI-PAK 21 TAB) 10 MG (21) TBPK tablet Take by mouth daily. Take 6 tabs by mouth daily for 1, then 5 tabs for 1 day, then 4 tabs for 1 day,  then 3 tabs for 1 day, then 2 tabs for 1 day, then 1 tab for 1 day. 08/17/24  Yes Janissa Bertram, DO  azithromycin  (ZITHROMAX  Z-PAK) 250 MG tablet 2 tabs po once day 1, then 1 tab po qd for next 4 days 08/17/24   Londyn Wotton, DO  levothyroxine (SYNTHROID, LEVOTHROID) 125 MCG tablet Take 125 mcg by mouth daily before breakfast.    [provider]    Family History Family History  Problem Relation Age of Onset   Diabetes Mother    Kidney disease Mother    Lung cancer Mother    Breast cancer Mother    CVA Father    Heart attack Father     Social History Social History   Tobacco Use   Smoking status: Never   Smokeless tobacco: Never  Vaping Use   Vaping status: Never Used  Substance Use Topics   Alcohol use: No   Drug use: No     Allergies   Iodine, Latex, Penicillins, Sulfa antibiotics, and Ciprofloxacin   Review of Systems Review of Systems: negative unless otherwise stated in HPI.      Physical Exam Triage Vital Signs ED Triage Vitals  Encounter Vitals Group     BP --  Girls Systolic BP Percentile --      Girls Diastolic BP Percentile --      Boys Systolic BP Percentile --      Boys Diastolic BP Percentile --      Pulse --      Resp 08/17/24 1122 16     Temp --      Temp Source 08/17/24 1122 Oral     SpO2 --      Weight --      Height --      Head Circumference --      Peak Flow --      Pain Score 08/17/24 1129 0     Pain Loc --      Pain Education --      Exclude from Growth Chart --    No data found.  Updated Vital Signs BP 120/80 (BP Location: Left Arm)   Pulse 81   Temp 99 F (37.2 C) (Oral)   Resp 16   Ht 5' 10 (1.778 m)   Wt 124.7 kg   SpO2 96%   BMI 39.46 kg/m   Visual Acuity Right Eye Distance:   Left Eye Distance:   Bilateral Distance:    Right Eye Near:   Left Eye Near:    Bilateral Near:     Physical Exam GEN:     alert, non-toxic appearing female in no distress    HENT:  mucus membranes moist, clear nasal  discharge, bilateral TM normal EYES:   no scleral injection or discharge RESP:  no increased work of breathing, coarse breathe sounds with decreased air movement on the right upper lung  CVS:   regular rate and rhythm Skin:   warm and dry, no rash on visible skin    UC Treatments / Results  Labs (all labs ordered are listed, but only abnormal results are displayed) Labs Reviewed  SARS CORONAVIRUS 2 BY RT PCR    EKG   Radiology DG Chest 2 View Result Date: 08/17/2024 CLINICAL DATA:  Cough and congestion for 10 days. EXAM: CHEST - 2 VIEW COMPARISON:  August 09, 2007 FINDINGS: The heart size and mediastinal contours are within normal limits. Mild to moderate severity infiltrate is seen within the suprahilar region of the right upper lobe. No pleural effusion or pneumothorax is identified. The visualized skeletal structures are unremarkable. IMPRESSION: Mild to moderate severity right upper lobe infiltrate. Follow-up to resolution is recommended to exclude the presence of an underlying neoplastic process. Electronically Signed   By: Suzen Dials M.D.   On: 08/17/2024 12:26     Procedures Procedures (including critical care time)  Medications Ordered in UC Medications - No data to display  Initial Impression / Assessment and Plan / UC Course  I have reviewed the triage vital signs and the nursing notes.  Pertinent labs & imaging results that were available during my care of the patient were reviewed by me and considered in my medical decision making (see chart for details).      Pt is a 58 y.o. female who presents for 1-2 weeks of cough that is not improving and new respiratory symptoms.  [name] is  afebrile here. Satting well on room air. Overall pt is  non-toxic appearing, well hydrated, without respiratory distress. Pulmonary exam is remarkable for coarse breathe sounds bilaterally and decreased air movement on the right with frequent cough.  After shared decision making, we  will pursue chest x-ray at this time.  COVID testing was negative.  Chest xray personally reviewed by me with signs of upper right lobar pneumonia without pleural effusion, cardiomegaly or pneumothorax. Patient aware the radiologist has not read her xray and is comfortable with the preliminary read by me. Will review radiologist read when available and call patient if a change in plan is warranted.  Pt agreeable to this plan prior to discharge.    Treat acute community acquired pneumonia with steroids and antibiotics as below. Tussionex cough syrup given for cough and allow patient to rest. Albuterol  for bronchospasm.  Typical duration of symptoms discussed. Return and ED precautions given and patient voiced understanding.   Discussed MDM, treatment plan and plan for follow-up with patient who agrees with plan.      Final Clinical Impressions(s) / UC Diagnoses   Final diagnoses:  Community acquired pneumonia of right upper lobe of lung     Discharge Instructions      Your chest xray did show some evidence of pneumonia though the radiologist has not yet read it. If they find something that I didn't, I will call you.    Stop by the pharmacy to pick up your prescriptions.  Follow up with your primary care provider or return to the urgent care, if not improving.        ED Prescriptions     Medication Sig Dispense Auth. Provider   azithromycin  (ZITHROMAX  Z-PAK) 250 MG tablet 2 tabs po once day 1, then 1 tab po qd for next 4 days 6 each Damarri Rampy, DO   predniSONE  (STERAPRED UNI-PAK 21 TAB) 10 MG (21) TBPK tablet Take by mouth daily. Take 6 tabs by mouth daily for 1, then 5 tabs for 1 day, then 4 tabs for 1 day, then 3 tabs for 1 day, then 2 tabs for 1 day, then 1 tab for 1 day. 21 tablet Stephany Poorman, DO   albuterol  (VENTOLIN  HFA) 108 (90 Base) MCG/ACT inhaler Inhale 2 puffs into the lungs every 4 (four) hours as needed. 6.7 g Kymberlee Viger, DO    chlorpheniramine-HYDROcodone (TUSSIONEX) 10-8 MG/5ML Take 5 mLs by mouth every 12 (twelve) hours as needed. 115 mL Isaish Alemu, DO      I have reviewed the PDMP during this encounter.   Dalyla Chui, DO 08/17/24 1325

## 2024-08-26 ENCOUNTER — Ambulatory Visit: Payer: Self-pay

## 2024-08-26 ENCOUNTER — Other Ambulatory Visit: Payer: Self-pay | Admitting: Family Medicine

## 2024-08-26 DIAGNOSIS — J189 Pneumonia, unspecified organism: Secondary | ICD-10-CM
